# Patient Record
Sex: Male | Born: 1990 | Race: White | Hispanic: No | Marital: Single | Smoking: Current every day smoker
Health system: Southern US, Community
[De-identification: ages and names within clinical notes are randomized; demographics above are authoritative.]

## PROBLEM LIST (undated history)

## (undated) HISTORY — PX: WISDOM TOOTH EXTRACTION: SHX21

## (undated) HISTORY — PX: OTHER SURGICAL HISTORY: SHX169

---
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
## 2018-07-05 ENCOUNTER — Other Ambulatory Visit: Payer: Self-pay

## 2018-07-05 ENCOUNTER — Encounter (HOSPITAL_COMMUNITY): Payer: Self-pay

## 2018-07-05 ENCOUNTER — Emergency Department (HOSPITAL_COMMUNITY)
Admission: EM | Admit: 2018-07-05 | Discharge: 2018-07-05 | Disposition: A | Discharge status: Home / Self Care | Payer: BLUE CROSS/BLUE SHIELD | Attending: Emergency Medicine | Admitting: Emergency Medicine

## 2018-07-05 DIAGNOSIS — W268XXA Contact with other sharp object(s), not elsewhere classified, initial encounter: Secondary | ICD-10-CM | POA: Diagnosis not present

## 2018-07-05 DIAGNOSIS — Y9389 Activity, other specified: Secondary | ICD-10-CM | POA: Diagnosis not present

## 2018-07-05 DIAGNOSIS — F17210 Nicotine dependence, cigarettes, uncomplicated: Secondary | ICD-10-CM | POA: Insufficient documentation

## 2018-07-05 DIAGNOSIS — Z23 Encounter for immunization: Secondary | ICD-10-CM | POA: Insufficient documentation

## 2018-07-05 DIAGNOSIS — Y929 Unspecified place or not applicable: Secondary | ICD-10-CM | POA: Insufficient documentation

## 2018-07-05 DIAGNOSIS — Y990 Civilian activity done for income or pay: Secondary | ICD-10-CM | POA: Diagnosis not present

## 2018-07-05 DIAGNOSIS — S0101XA Laceration without foreign body of scalp, initial encounter: Secondary | ICD-10-CM | POA: Insufficient documentation

## 2018-07-05 MED ORDER — TETANUS-DIPHTH-ACELL PERTUSSIS 5-2.5-18.5 LF-MCG/0.5 IM SUSP
0.5000 mL | Freq: Once | INTRAMUSCULAR | Status: AC
  Administered 2018-07-05: 0.5 mL via INTRAMUSCULAR
  Filled 2018-07-05: qty 0.5

## 2018-07-05 MED ORDER — LIDOCAINE HCL (PF) 1 % IJ SOLN
5.0000 mL | Freq: Once | INTRAMUSCULAR | Status: AC
  Administered 2018-07-05: 5 mL via INTRADERMAL
  Filled 2018-07-05: qty 30

## 2018-07-05 MED ORDER — ACETAMINOPHEN 325 MG PO TABS
650.0000 mg | ORAL_TABLET | Freq: Once | ORAL | Status: AC
  Administered 2018-07-05: 650 mg via ORAL
  Filled 2018-07-05: qty 2

## 2018-07-05 NOTE — ED Triage Notes (Signed)
Pt comes from home. Pt arrived via POV. 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 Pt is AOx4 and ambulatory.

## 2018-07-05 NOTE — ED Notes (Signed)
Patient verbalized understanding of discharge instructions, no questions. Patient ambulated out of ED with steady gait in no distress.  

## 2018-07-05 NOTE — Discharge Instructions (Addendum)
Please have staples (5) removed in 7-10 days.You may clean area with soap and water. Refrain from hats. Alternate tylenol or ibuprofen for pain.Please return to the ED if you experience any of the following symptoms: You have a fever. You have chills. You have drainage, redness, swelling, or pain at your wound. There is a bad smell coming from your wound. The skin edges of your wound start to separate after your sutures have been removed. Your wound becomes thick, raised, and darker in color after your sutures come out (scarring).

## 2018-07-05 NOTE — ED Provider Notes (Signed)
[facility]-EMERGENCY DEPT Provider Note   CSN: [id number] Arrival date & time: 07/05/18  1443     History   Chief Complaint Chief Complaint  Patient presents with  . Head Laceration    HPI Nathaniel Fernandez is a 27 y.o. male.  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 Patient states he proceeded to apply pressure with paper towels but quickly had blood cover the paper towels. Patient then went to the UC around the area and was told "we can't do anything". They applied a pressure dressing to his head and send him here. He denies any headache, LOC, headache, chest pain or shortness of breath. Patient's tetanus shot is unknown.      History reviewed. No pertinent past medical history.  There are no active problems to display for this patient.   Past Surgical History:  Procedure Laterality Date  . Hairline fracuture of left elbow    . WISDOM TOOTH EXTRACTION          Home Medications    Prior to Admission medications   Not on File    Family History No family history on file.  Social History Social History   Tobacco Use  . Smoking status: Current Every Day Smoker    Packs/day: 1.50    Years: 8.00    Pack years: 12.00    Types: Cigarettes  . Smokeless tobacco: Never Used  Substance Use Topics  . Alcohol use: Yes    Comment: 24 pack a week  . Drug use: Never     Allergies   Amoxicillin   Review of Systems Review of Systems  Constitutional: Negative for chills and fever.  HENT: Negative for ear pain and sore throat.   Eyes: Negative for pain and visual disturbance.  Respiratory: Negative for cough and shortness of breath.   Cardiovascular: Negative for chest pain and palpitations.  Gastrointestinal: Negative for abdominal pain  and vomiting.  Genitourinary: Negative for dysuria and hematuria.  Musculoskeletal: Negative for arthralgias and back pain.  Skin: Positive for wound. Negative for color change and rash.  Neurological: Negative for seizures and syncope.  All other systems reviewed and are negative.    Physical Exam Updated Vital Signs BP (!) 155/91 (BP Location: Left Arm)   Pulse 86   Temp 98.6 F (37 C) (Oral)   Resp 19   Ht 5\' 7"  (1.702 m)   Wt 97.5 kg   SpO2 98%   BMI 33.67 kg/m   Physical Exam  Constitutional: He is oriented to person, place, and time. He appears well-developed and well-nourished.  HENT:  Head: Normocephalic. Head is with laceration.    Eyes: Pupils are equal, round, and reactive to light.  Neck: Normal range of motion. Neck supple.  Cardiovascular: Normal heart sounds.  Pulmonary/Chest: Effort normal and breath sounds normal. He has no wheezes.  Abdominal: Soft. Bowel sounds are normal.  Musculoskeletal: He exhibits no tenderness or deformity.  Neurological: He is alert and oriented to person, place, and time.  Skin: Skin is warm and dry.  Nursing note and vitals reviewed.    ED Treatments / Results  Labs (all labs ordered are listed, but only abnormal results are displayed) Labs Reviewed - No  data to display  EKG None  Radiology No results found.  Procedures .Marland Kitchen.Laceration Repair Date/Time: 07/05/2018 4:58 PM Performed by: Claude MangesSoto, Jacolyn Joaquin, PA-C Authorized by: Claude MangesSoto, Jerni Selmer, PA-C   Consent:    Consent obtained:  Verbal   Consent given by:  Patient   Risks discussed:  Infection, pain, poor cosmetic result and poor wound healing   Alternatives discussed:  No treatment, delayed treatment and observation Anesthesia (see MAR for exact dosages):    Anesthesia method:  Local infiltration   Local anesthetic:  Lidocaine 1% w/o epi Laceration details:    Location:  Scalp   Scalp location:  R parietal   Length (cm):  5   Depth (mm):  0.5 Repair type:     Repair type:  Simple Exploration:    Hemostasis achieved with:  Direct pressure   Contaminated: no   Treatment:    Area cleansed with:  Saline   Amount of cleaning:  Extensive   Irrigation solution:  Sterile saline Skin repair:    Repair method:  Staples   Number of staples:  5 Approximation:    Approximation:  Close Post-procedure details:    Dressing:  Open (no dressing)   Patient tolerance of procedure:  Tolerated well, no immediate complications   (including critical care time)  Medications Ordered in ED Medications  lidocaine (PF) (XYLOCAINE) 1 % injection 5 mL (has no administration in time range)  acetaminophen (TYLENOL) tablet 650 mg (650 mg Oral Given 07/05/18 1555)  Tdap (BOOSTRIX) injection 0.5 mL (0.5 mLs Intramuscular Given 07/05/18 1556)     Initial Impression / Assessment and Plan / ED Course  I have reviewed the triage vital signs and the nursing notes.  Pertinent labs & imaging results that were available during my care of the patient were reviewed by me and considered in my medical decision making (see chart for details).     Patient presents with scalp laceration < 5 cm, patient was given tylenol for pain relieve. I closed his laceration with 5 staples to the scalp. Lido was applied for numbing purposes, bleeding was controlled. Patient tolerated procedure well.Tetanus shot was updated. Patient is aware he needs to have staples remove in 7-10 days. Return precautions provided.   Final Clinical Impressions(s) / ED Diagnoses   Final diagnoses:  Laceration of scalp, initial encounter    ED Discharge Orders    None       Claude MangesSoto, Tarell Schollmeyer, PA-C 07/05/18 1703    Virgina Norfolkuratolo, Adam, DO 07/06/18 0102
